# Patient Record
Sex: Female | Born: 1960 | Race: White | Hispanic: No | Marital: Married | Smoking: Former smoker
Health system: Southern US, Community
[De-identification: ages and names within clinical notes are randomized; demographics above are authoritative.]

## PROBLEM LIST (undated history)

## (undated) HISTORY — PX: CHOLECYSTECTOMY: SHX55

## (undated) HISTORY — PX: TONSILLECTOMY: SUR1361

---

## 2006-08-22 ENCOUNTER — Inpatient Hospital Stay: Payer: Self-pay | Admitting: Vascular Surgery

## 2006-08-23 ENCOUNTER — Other Ambulatory Visit: Payer: Self-pay

## 2008-05-09 IMAGING — US ABDOMEN ULTRASOUND
1 series · 17 of 25 positions shown · non-contrast
Comparison: none

REASON FOR EXAM: abd pain
COMMENTS:

[Series 1: abdomen ultrasound · 17 of 55 slices shown]
[im 1/55]
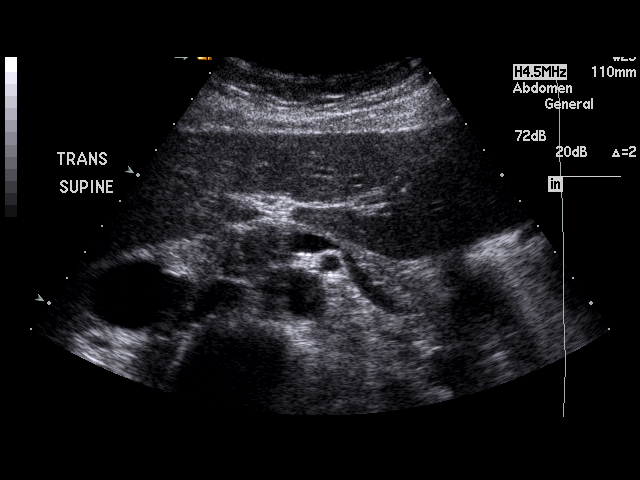
[im 5/55]
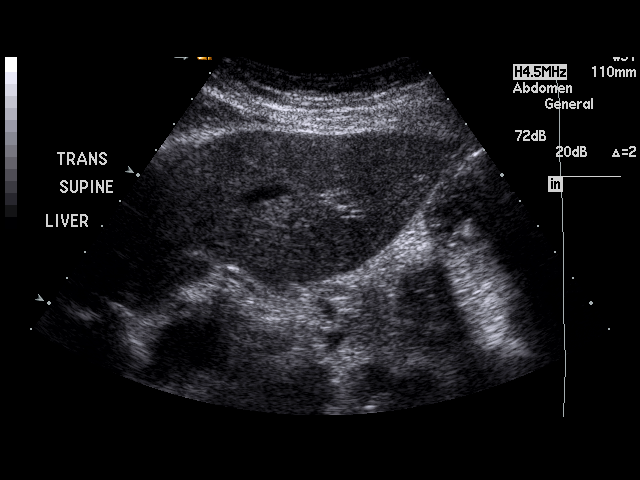
[im 7/55]
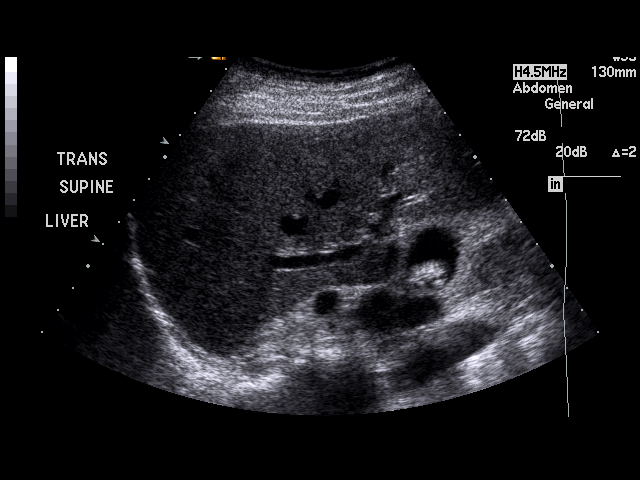
[im 12/55]
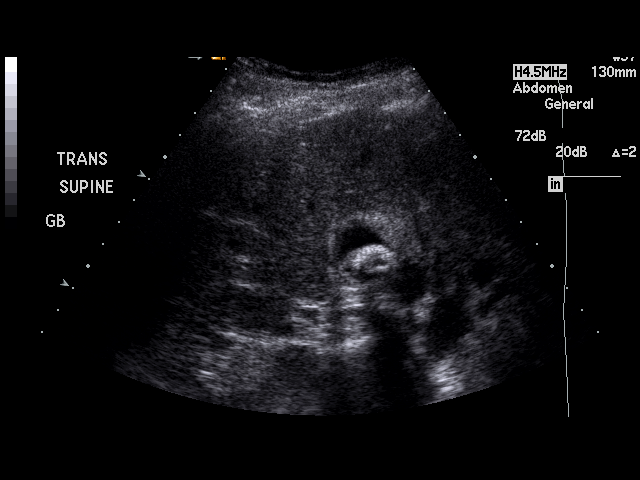
[im 14/55]
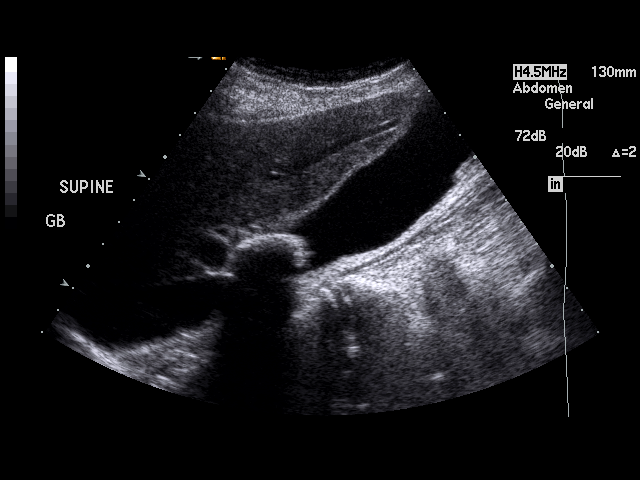
[im 19/55]
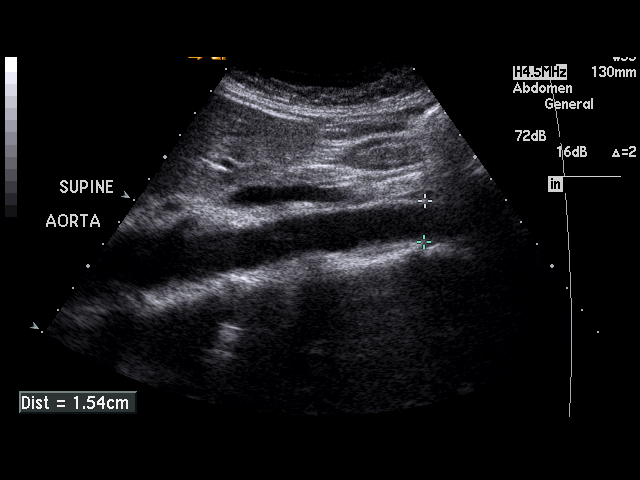
[im 21/55]
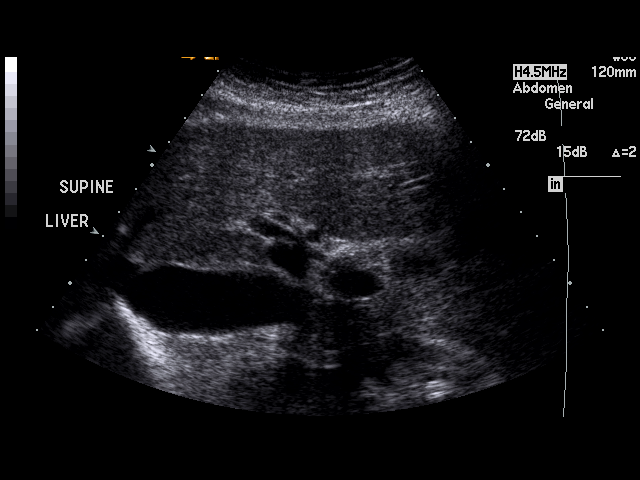
[im 25/55]
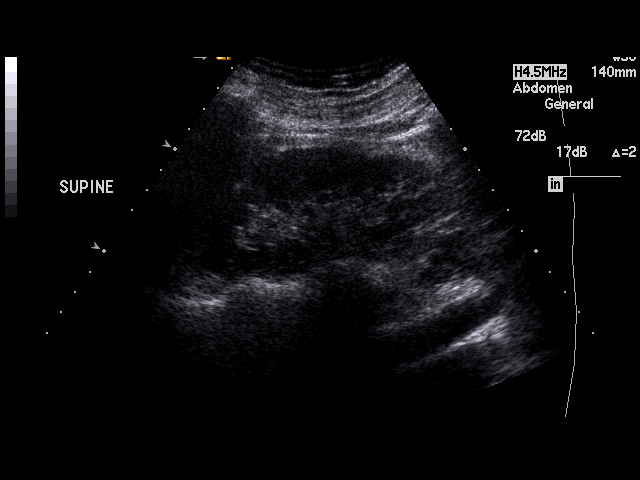
[im 28/55]
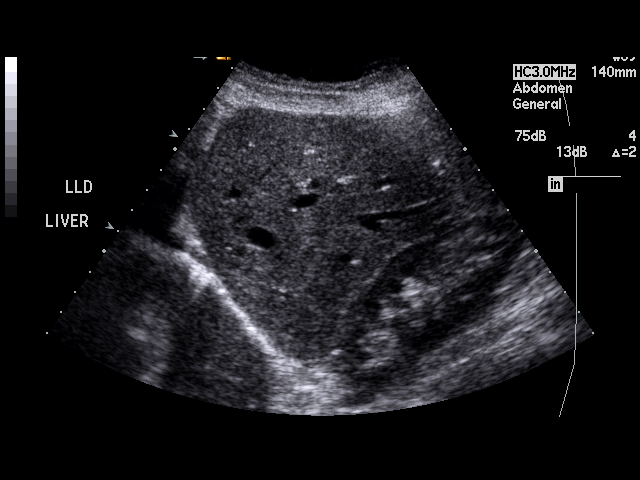
[im 30/55]
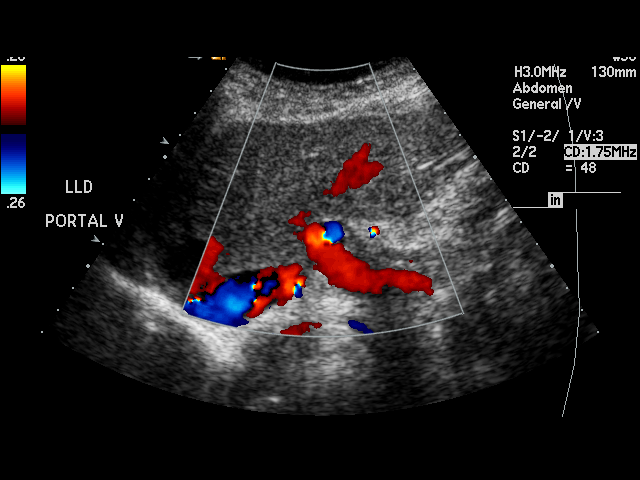
[im 34/55]
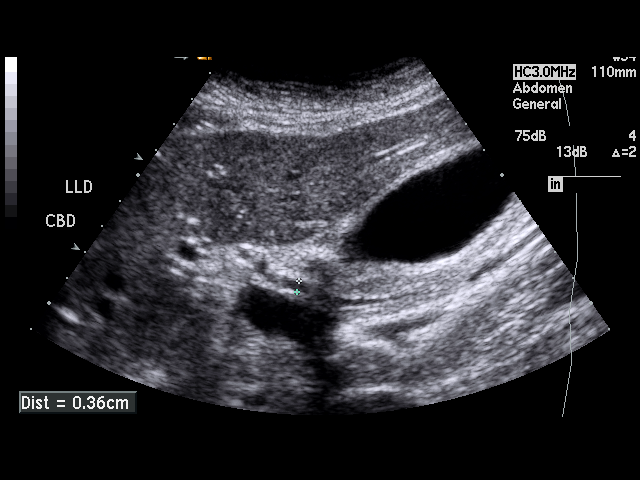
[im 37/55]
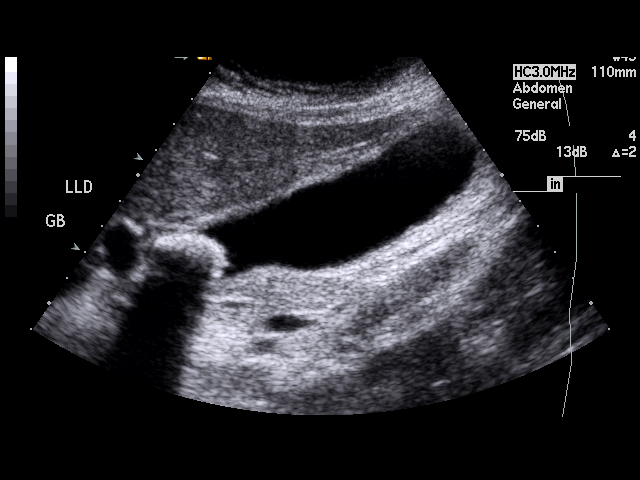
[im 41/55]
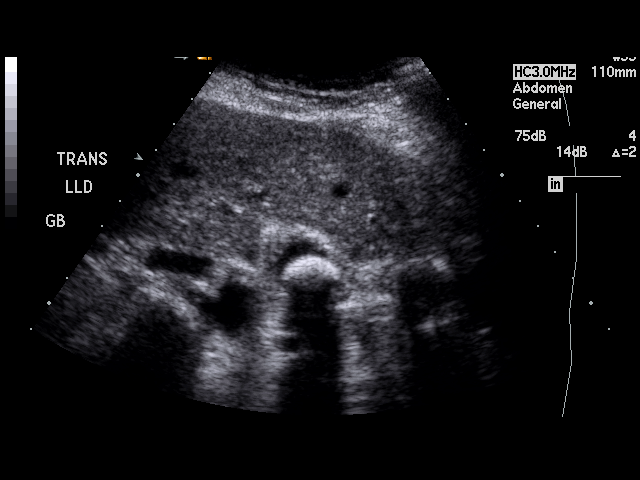
[im 43/55]
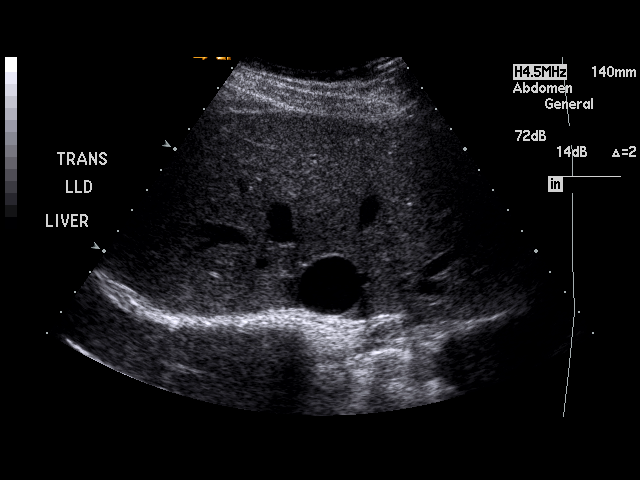
[im 48/55]
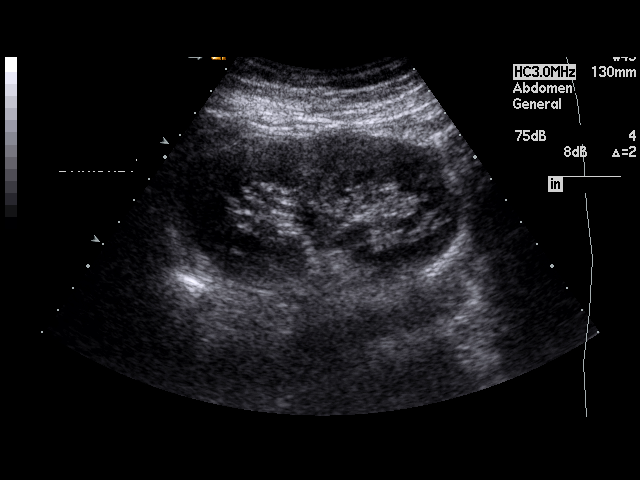
[im 50/55]
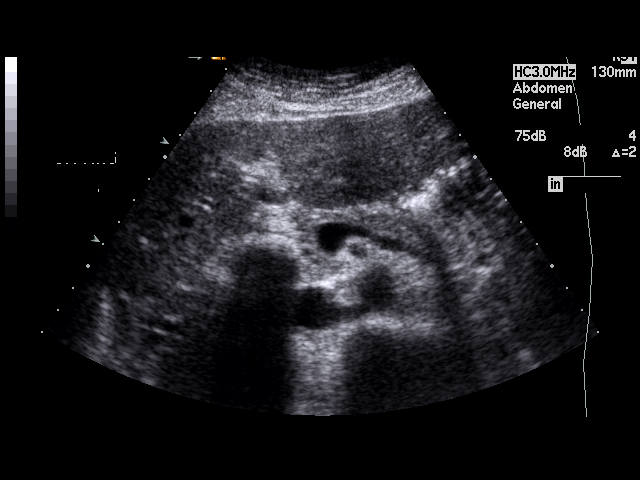
[im 55/55]
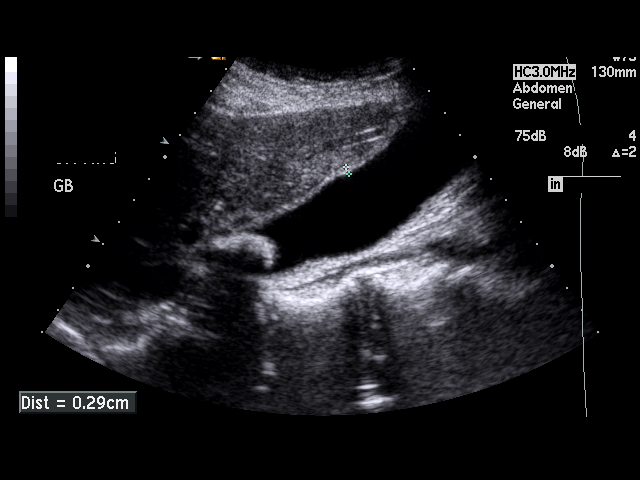

[17 of 25 positions shown; findings below may reference images not displayed]

PROCEDURE:     US  - US ABDOMEN GENERAL SURVEY  - August 22, 2006  [DATE]

RESULT:     Emergent ultrasound of the abdomen demonstrates cholelithiasis
with what appear to be a large stone in the gallbladder neck which is
nonmobile. The gallbladder wall is thickened measuring 2.9 mm. A positive
sonographic Murphy's sign is reported. The findings are consistent with
cholelithiasis with acute cholecystitis. Surgical correlation is suggested.
The liver, aorta, spleen, pancreas and kidneys appear to be normal. The
common bile duct diameter is normal at 3.6 mm.
IMPRESSION: 1. Cholelithiasis with changes of acute cholecystitis.

## 2016-05-11 ENCOUNTER — Ambulatory Visit
Admission: RE | Admit: 2016-05-11 | Discharge: 2016-05-11 | Disposition: A | Discharge status: Home / Self Care | Payer: Self-pay | Source: Ambulatory Visit | Attending: Pulmonary Disease | Admitting: Pulmonary Disease

## 2016-05-11 ENCOUNTER — Ambulatory Visit (INDEPENDENT_AMBULATORY_CARE_PROVIDER_SITE_OTHER): Payer: Self-pay | Admitting: Pulmonary Disease

## 2016-05-11 ENCOUNTER — Encounter: Payer: Self-pay | Admitting: Pulmonary Disease

## 2016-05-11 ENCOUNTER — Encounter: Payer: Self-pay | Admitting: *Deleted

## 2016-05-11 VITALS — BP 132/78 | HR 80 | Wt 129.0 lb

## 2016-05-11 DIAGNOSIS — J439 Emphysema, unspecified: Secondary | ICD-10-CM

## 2016-05-11 DIAGNOSIS — J984 Other disorders of lung: Secondary | ICD-10-CM | POA: Insufficient documentation

## 2016-05-11 DIAGNOSIS — F17200 Nicotine dependence, unspecified, uncomplicated: Secondary | ICD-10-CM

## 2016-05-11 DIAGNOSIS — J45909 Unspecified asthma, uncomplicated: Secondary | ICD-10-CM

## 2016-05-11 MED ORDER — UMECLIDINIUM-VILANTEROL 62.5-25 MCG/INH IN AEPB: 1.0000 | INHALATION_SPRAY | Freq: Every day | RESPIRATORY_TRACT | 5 refills | Status: AC

## 2016-05-11 NOTE — Patient Instructions (Signed)
You must quit smoking as we discussed  Trial of Anoro inhaler-1 inhalation daily  Continue albuterol inhaler as needed-try one inhalation at a time to minimize racing heart  Chest x-ray today  Follow-up in 6-8 weeks with lung function tests prior to that visit

## 2016-05-11 NOTE — Progress Notes (Signed)
PULMONARY CONSULT NOTE  Requesting MD/Service: Mel Almondumey, [facility] Date of initial consultation: 05/11/16 Reason for consultation: Smoker, DOE, suspected COPD   PT PROFILE: 56 y.o. female smoker evaluated for exertional dyspnea and suspected COPD  DATA:  HPI:  Mr. Tammy Mullen is a 56 year old woman who smokes approximately half pack of cigarettes a day and has smoked as much as a pack of cigarettes per day since her teenage years. Her history of present illness dates to January 29 of this year and she was exposed to an extremely dusty environment at work (she works at [employer], a [employer] facility). Since that time she has suffered severe exertional dyspnea. She was seen at the [facility] walk-in clinic on the 02/01 productive cough and was initially treated with prednisone and a cough suppressant. She was seen again in follow-up on 02/07 and was treated with more prednisone, azithromycin and an albuterol inhaler. She is now not using the albuterol due to tachycardia. She has remained out of work since the onset of this illness. She continues to suffer with class II dyspnea. She has mild cough which is minimally productive. She has no prior documented pulmonary history and has never seen a pulmonologist in the past. She has no proven allergies but believes that she might be allergic to small animals. She continues to smoke a half pack cigarettes per day.Denies CP, fever, purulent sputum, hemoptysis, LE edema and calf tenderness   History reviewed. No pertinent past medical history.  Past Surgical History:  Procedure Laterality Date  . CHOLECYSTECTOMY    . TONSILLECTOMY      MEDICATIONS: I have reviewed all medications and confirmed regimen as documented  Social History   Social History  . Marital status: Married    Spouse name: N/A  . Number of children: N/A  . Years of education: N/A   Occupational History  . Not on file.   Social History Main Topics  . Smoking status: Current Every  Day Smoker    Packs/day: 0.50  . Smokeless tobacco: Never Used  . Alcohol use Yes     Comment: few times a year  . Drug use: No  . Sexual activity: Not on file   Other Topics Concern  . Not on file   Social History Narrative  . No narrative on file    Family History  Problem Relation Age of Onset  . Lung cancer Mother   . Osteoporosis Mother   . Diabetes Father   . Heart disease Father   . Hyperlipidemia Father   . Hypertension Father   . Leukemia Father   . Prostate cancer Father   . Thyroid disease Sister   . Rheum arthritis Sister   . Diabetes Brother   . Osteoporosis Maternal Grandmother   . Breast cancer Paternal Grandmother   . Stroke Paternal Grandmother   . Diabetes Paternal Grandfather   . Emphysema Paternal Grandfather     ROS: No fever, myalgias/arthralgias, unexplained weight loss or weight gain No new focal weakness or sensory deficits No otalgia, hearing loss, visual changes, nasal and sinus symptoms, mouth and throat problems No neck pain or adenopathy No abdominal pain, N/V/D, diarrhea, change in bowel pattern No dysuria, change in urinary pattern   Vitals:   05/11/16 0956  BP: 132/78  Pulse: 80  SpO2: 98%  Weight: 129 lb (58.5 kg)     EXAM:  Gen: WDWN, No overt respiratory distress HEENT: NCAT, sclera white, oropharynx normal Neck: Supple without LAN, thyromegaly, JVD Lungs: breath  sounds Are markedly diminished, percussion note is hyperresonant throughout, there are no wheezes or other adventitious sounds Cardiovascular: RRR, no murmurs noted Abdomen: Soft, nontender, normal BS Ext: without clubbing, cyanosis, edema Neuro: CNs grossly intact, motor and sensory intact Skin: Limited exam, no lesions noted  DATA:   No flowsheet data found.  No flowsheet data found.  CXR:  A chest x-ray was performed on 02/07 but the results and images are not available to me  IMPRESSION:     ICD-9-CM ICD-10-CM   1. Suspected emphysema/COPD 492.8  J43.9 DG Chest 2 View     Pulmonary Function Test [facility] Only     CANCELED: Pulmonary Function Test [facility] Only  2. 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 Smoker 305.1 F17.200      PLAN:  1) I counseled her in detail regarding the need for smoking cessation. I made it very clear that there is little that we can do to improve her respiratory status in the face of ongoing smoking. 2) trial of Anoro inhaler- 1 inhalation daily 3) continue albuterol inhaler as needed-I instructed her to use one inhalation at a time to minimize tachycardia 4) chest x-ray ordered for today 5) follow-up in 6-8 weeks with PFTs prior to that visit   Billy Fischer, MD PCCM service Mobile [phone] Pager [phone] 05/11/2016  Addendum:Chest x-ray reveals severe hyperinflation and hyperlucency consistent with emphysema  Billy Fischer, MD PCCM service Mobile [phone] Pager [phone] 05/11/2016

## 2016-05-11 NOTE — Progress Notes (Signed)
Patient ID: Tammy DredgeJoan C Suddreth, female   DOB: 08-Jul-1960, 56 y.o.   MRN: [id number]   Patient seen in the office today and instructed on use of Anoro.  Patient expressed understanding and demonstrated technique.

## 2016-05-14 ENCOUNTER — Telehealth: Payer: Self-pay | Admitting: Pulmonary Disease

## 2016-05-14 NOTE — Telephone Encounter (Signed)
Pt would like xray results. Please call. 

## 2016-05-14 NOTE — Telephone Encounter (Signed)
Please advise on CXR results. Thanks! 

## 2016-05-15 NOTE — Telephone Encounter (Signed)
CXR shows changes of emphysema. Nothing acute on it  PatonDave

## 2016-05-15 NOTE — Telephone Encounter (Signed)
Pt informed. Nothing further needed. 

## 2016-05-16 ENCOUNTER — Telehealth: Payer: Self-pay | Admitting: Pulmonary Disease

## 2016-05-16 NOTE — Telephone Encounter (Signed)
Please advise on message below Thanks

## 2016-05-16 NOTE — Telephone Encounter (Signed)
Patient says at last ov simonds talked about a dx of occupational asthma / occupational bronchitis. This however was not in the paper work that she submitted to employer . Can she have something that says this  in writing please call.

## 2016-05-23 NOTE — Telephone Encounter (Signed)
Will address further on follow up  Tammy Mullen

## 2016-05-23 NOTE — Telephone Encounter (Signed)
Pt informed. Nothing further needed. 

## 2016-06-26 ENCOUNTER — Ambulatory Visit: Payer: Self-pay | Attending: Pulmonary Disease

## 2016-06-26 DIAGNOSIS — J439 Emphysema, unspecified: Secondary | ICD-10-CM

## 2016-06-26 DIAGNOSIS — J45909 Unspecified asthma, uncomplicated: Secondary | ICD-10-CM

## 2016-06-26 DIAGNOSIS — J449 Chronic obstructive pulmonary disease, unspecified: Secondary | ICD-10-CM | POA: Insufficient documentation

## 2016-06-26 MED ORDER — ALBUTEROL SULFATE (2.5 MG/3ML) 0.083% IN NEBU
2.5000 mg | INHALATION_SOLUTION | Freq: Once | RESPIRATORY_TRACT | Status: AC
  Administered 2016-06-26: 2.5 mg via RESPIRATORY_TRACT
  Filled 2016-06-26: qty 3

## 2016-07-02 ENCOUNTER — Ambulatory Visit (INDEPENDENT_AMBULATORY_CARE_PROVIDER_SITE_OTHER): Payer: Self-pay | Admitting: Pulmonary Disease

## 2016-07-02 ENCOUNTER — Encounter: Payer: Self-pay | Admitting: Pulmonary Disease

## 2016-07-02 VITALS — BP 138/82 | HR 85 | Resp 16 | Ht 62.0 in | Wt 125.0 lb

## 2016-07-02 DIAGNOSIS — F17200 Nicotine dependence, unspecified, uncomplicated: Secondary | ICD-10-CM

## 2016-07-02 DIAGNOSIS — Z23 Encounter for immunization: Secondary | ICD-10-CM

## 2016-07-02 DIAGNOSIS — J449 Chronic obstructive pulmonary disease, unspecified: Secondary | ICD-10-CM

## 2016-07-02 MED ORDER — PNEUMOCOCCAL VAC POLYVALENT 25 MCG/0.5ML IJ INJ
0.5000 mL | INJECTION | Freq: Once | INTRAMUSCULAR | Status: AC
  Administered 2016-07-02: 0.5 mL via INTRAMUSCULAR

## 2016-07-02 MED ORDER — UMECLIDINIUM-VILANTEROL 62.5-25 MCG/INH IN AEPB: 1.0000 | INHALATION_SPRAY | Freq: Every day | RESPIRATORY_TRACT | 0 refills | Status: AC

## 2016-07-02 NOTE — Progress Notes (Signed)
PULMONARY OFFICE FOLLOW UP NOTE  Requesting MD/Service: Mel Almondumey, [facility] with [facility] Date of initial consultation: 05/11/16 Reason for consultation: Smoker, DOE, suspected COPD   PT PROFILE: 56 y.o. female smoker evaluated for exertional dyspnea and suspected COPD  DATA: CXR 05/11/16: Moderate to severe hyperinflation, no acute cardiac or pulmonary disease PFTs 06/26/16: FVC:  2.14 L ( 75 %pred), FEV1:  1.33 L ( 5 7 %pred), FEV1/FVC:  , 6.55 TLC:  L ( 144 %pred), DLCO  43 %pred   Subjective:  Since last visit, she used the Anoro inhaler that was provided as a sample. She felt that this caused chest pain and did not refill the prescription. She has quit smoking but is using E cigarettes. However, he states that she is rarely using these. Overall she feels no significant change. Denies CP, fever, purulent sputum, hemoptysis, LE edema and calf tenderness.  Objective:  Vitals:   07/02/16 0844 07/02/16 0853  BP:  138/82  Pulse:  85  Resp: 16   SpO2:  98%  Weight: 125 lb (56.7 kg)   Height: 5\' 2"  (1.575 m)      EXAM:   Gen: WDWN in NAD HEENT: NCAT, sclerae white, oropharynx normal Neck: NO LAN, no JVD noted Lungs: Breath sounds markedly diminished, no wheezes Cardiovascular: RRR, no M noted Abdomen: Soft, NT, +BS Ext: no C/C/E Neuro: PERRL, EOMI, motor/sensory grossly intact Skin: No lesions noted   DATA:   No flowsheet data found.  No flowsheet data found.  CXR: No new film  IMPRESSION:     ICD-9-CM ICD-10-CM   1. Moderate COPD (chronic obstructive pulmonary disease) (HCC) 496 J44.9   2. Smoker 305.1 F17.200   3. Need for prophylactic vaccination against Streptococcus pneumoniae (pneumococcus) V03.82 Z23 pneumococcal 23 valent vaccine (PNU-IMMUNE) injection 0.5 mL   Her COPD is predominantly emphysema. However, there does appear to be an asthmatic component as evidenced by the fact that she experienced increased respiratory symptoms after exposure to a dusty environment at  work.  I do not think that Anoro was the cause of her chest pain. This would seem an unusual adverse reaction to that medication.  PLAN:  1) I have congratulated her on her efforts at smoking cessation thus far. I've encouraged her to continue these efforts with the ultimate goal of being liberated from her nicotine addiction. 2) retry Anoro inhaler - one inhalation daily 3) Pneumovax administered today 4) we discussed other aspects of treatment for COPD including maintaining her activity level and proper body weight 5) follow-up in 2-3 months   Billy Fischeravid Simonds, MD PCCM service Mobile [phone] Pager [phone] 07/02/2016 10:10 AM

## 2016-07-02 NOTE — Patient Instructions (Addendum)
Good work on the smoking cessation efforts so far. Keep up the good work Retry Anoro inhaler - one inhalation daily. Sample provided Pneumonia vaccine today You need an annual flu vaccination Follow-up in 2-3 months

## 2017-05-22 ENCOUNTER — Encounter: Payer: Self-pay | Admitting: Pulmonary Disease

## 2017-05-22 ENCOUNTER — Telehealth: Payer: Self-pay | Admitting: Pulmonary Disease

## 2017-05-22 NOTE — Telephone Encounter (Signed)
3 attempts to schedule fu appt from recall list.  Mailed Letter. Deleting recall.  ° °

## 2018-01-27 IMAGING — CR DG CHEST 2V
1 series · 2 of 2 positions shown · non-contrast
Comparison: 08/22/2006

CLINICAL DATA: Shortness of breath for 3 weeks, smoker, emphysema

EXAM:
CHEST  2 VIEW

[Series 1: w chest pa · 0.14mm/px · 2 of 2 slices shown]
[im 1/2]
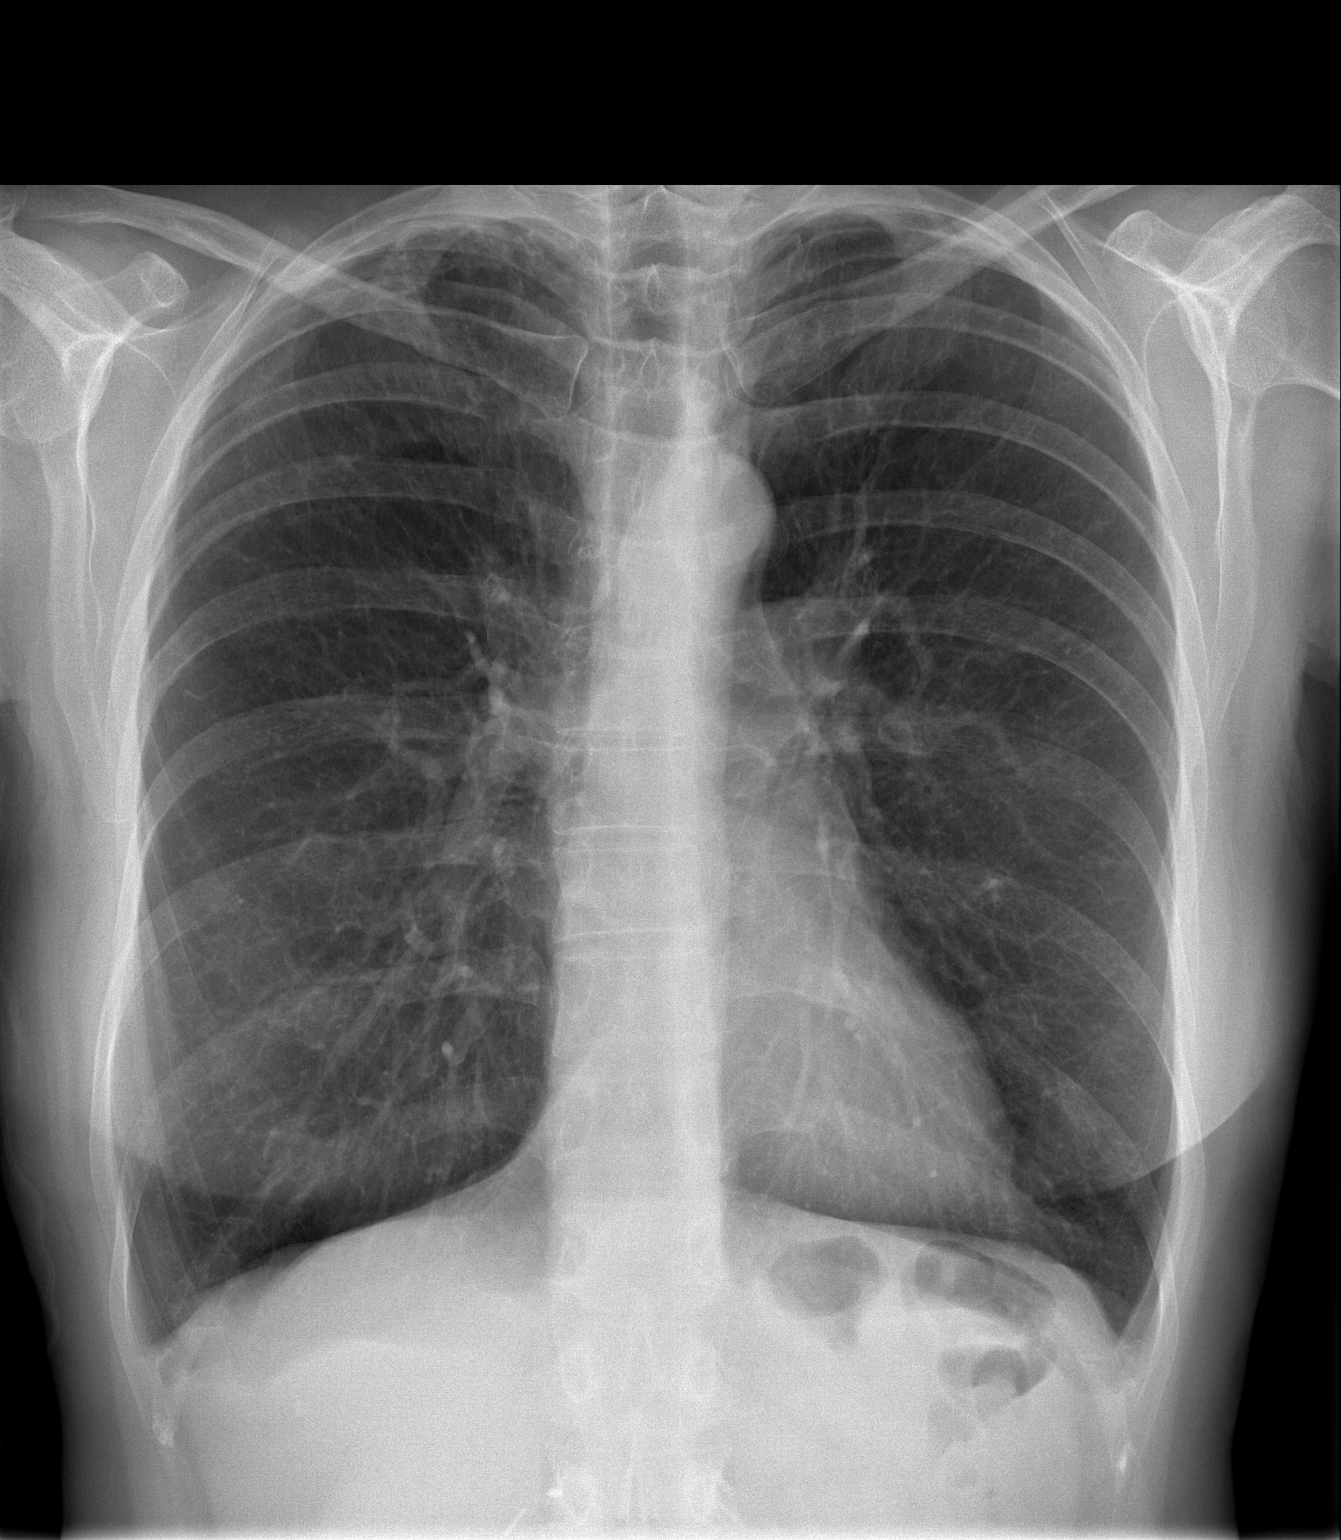
[im 2/2]
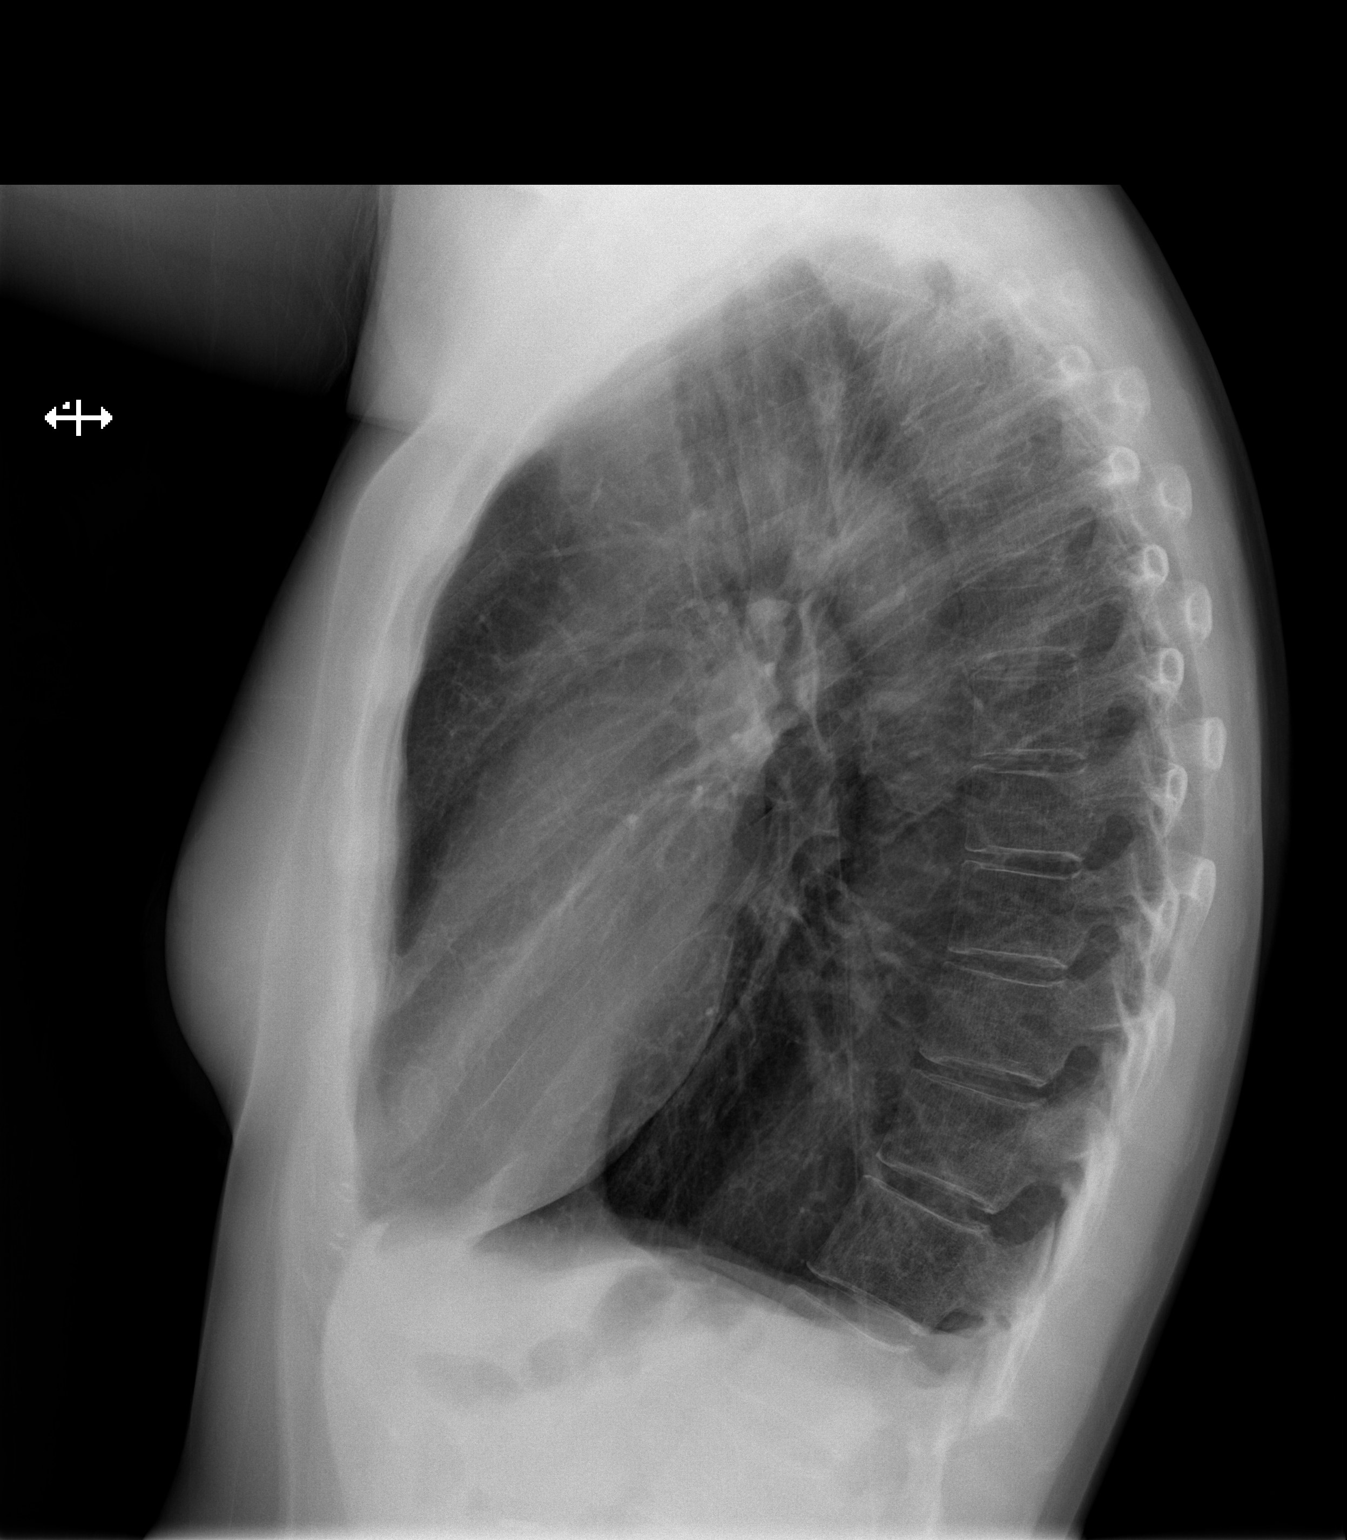

[2 of 2 positions shown; findings below may reference images not displayed]

FINDINGS: Cardiomediastinal silhouette is stable. Hyperinflation is noted.
Mild bilateral apical pleuroparenchymal scarring. No infiltrate or
pulmonary edema. Bony thorax is unremarkable.
IMPRESSION: No active disease. Mild bilateral apical pleuroparenchymal scarring.
Hyperinflation is noted.
